# Patient Record
Sex: Male | Born: 1945 | Race: White | Hispanic: No | Marital: Married
Health system: Southern US, Community
[De-identification: ages and names within clinical notes are randomized; demographics above are authoritative.]

---

## 2004-02-08 ENCOUNTER — Encounter: Admission: RE | Admit: 2004-02-08 | Discharge: 2004-02-08 | Payer: Self-pay | Admitting: Internal Medicine

## 2004-02-08 ENCOUNTER — Inpatient Hospital Stay (HOSPITAL_COMMUNITY): Admission: EM | Admit: 2004-02-08 | Discharge: 2004-02-14 | Payer: Self-pay | Admitting: Emergency Medicine

## 2005-02-18 IMAGING — US US ABDOMEN COMPLETE
1 series · 14 of 25 positions shown · non-contrast
Comparison: none

CLINICAL DATA: Abdominal pain. 
 UPPER ABDOMINAL SONOGRAM COMPLETE
 Multiplanar imaging shows normal gallbladder size and contours.  No stones or wall thickening, but there is reproducible sludge in the dependent portion.  Sludge can be normal in patients who have been fasting for several days.  It is abnormal if the patient has been eating a normal diet.  This needs to be clinically correlated.  Consider nuclear HIDA study if further assessment warranted.  Common duct 4.8 mm.  No focal lesions of the liver or spleen.  Kidney size normal.  No hydronephrosis.  Aorta and IVC normal. 
 IMPRESSION
 Normal except for the presence of biliary sludge ? see report.

[Series 1: unknown · 0.33mm/px · 14 of 66 slices shown]
[im 1/66]
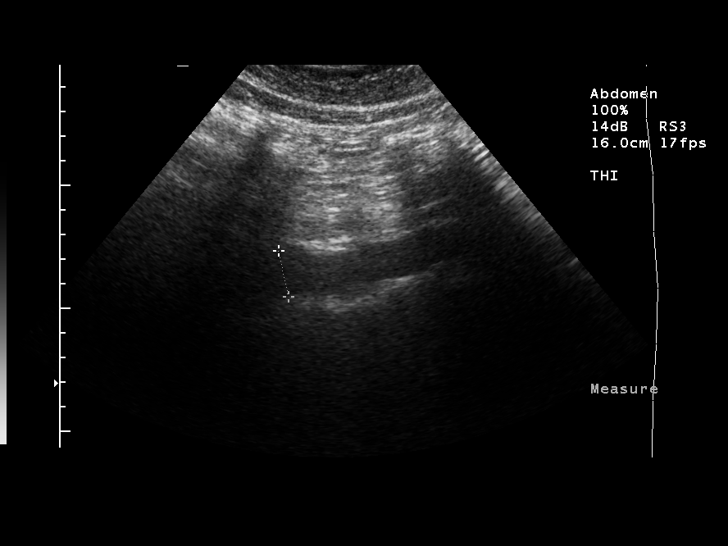
[im 6/66]
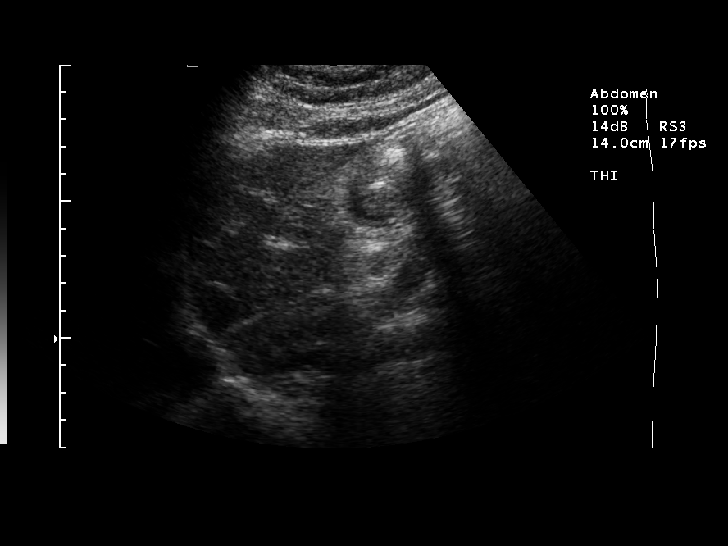
[im 11/66]
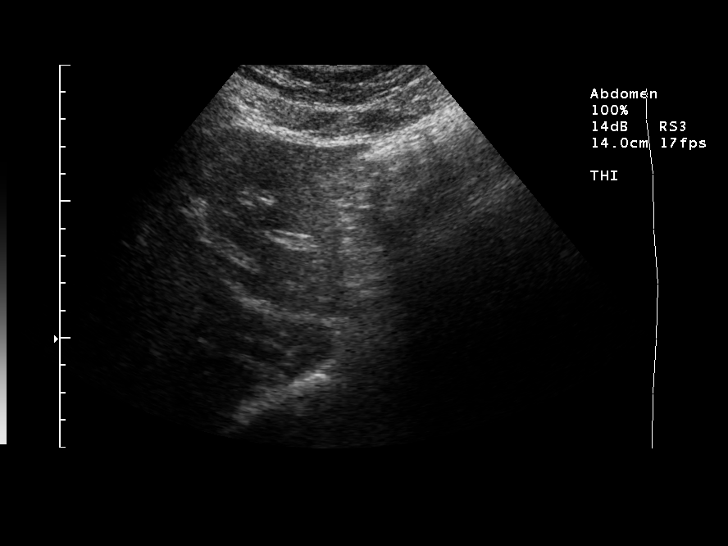
[im 17/66]
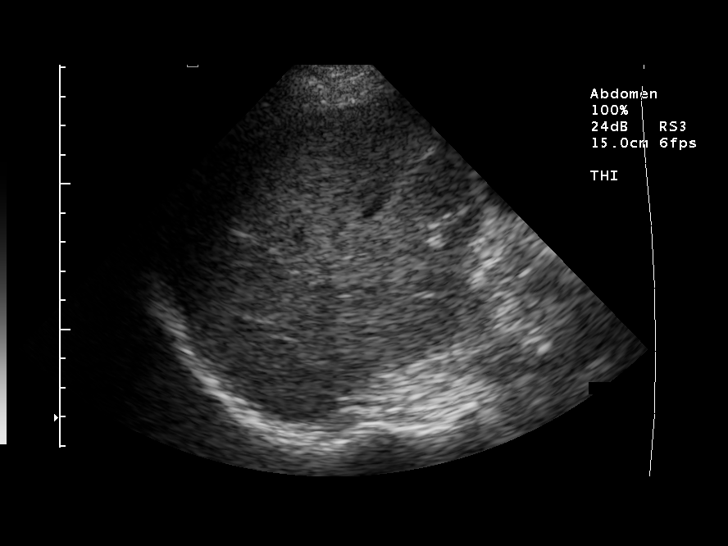
[im 22/66]
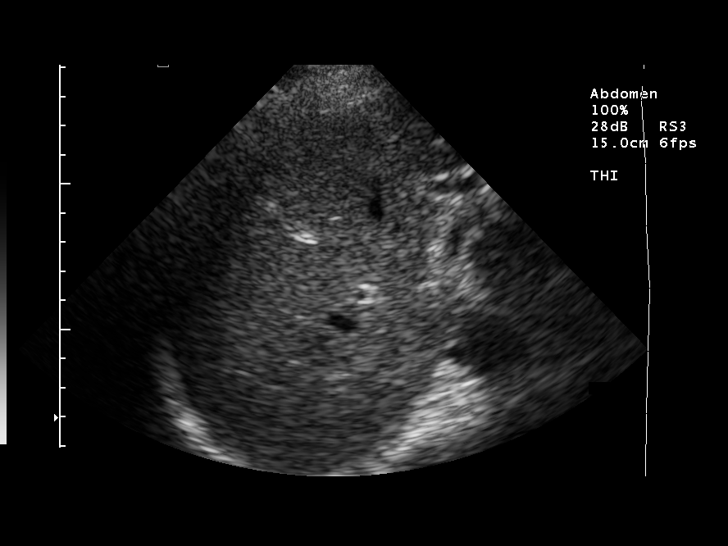
[im 25/66]
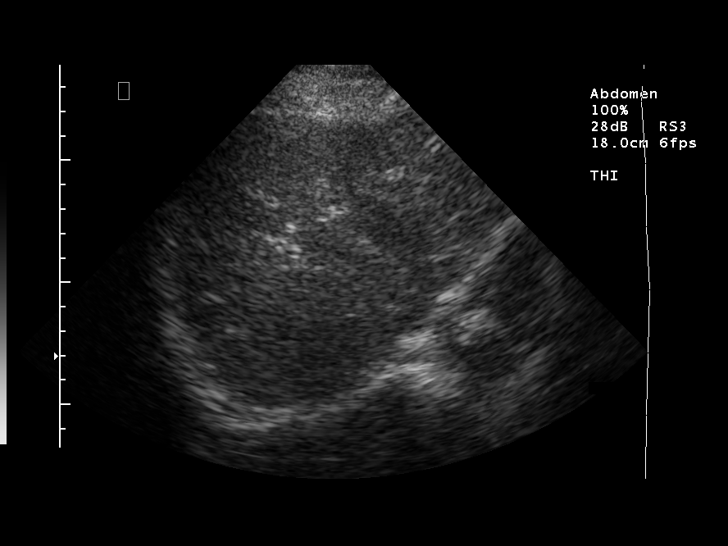
[im 30/66]
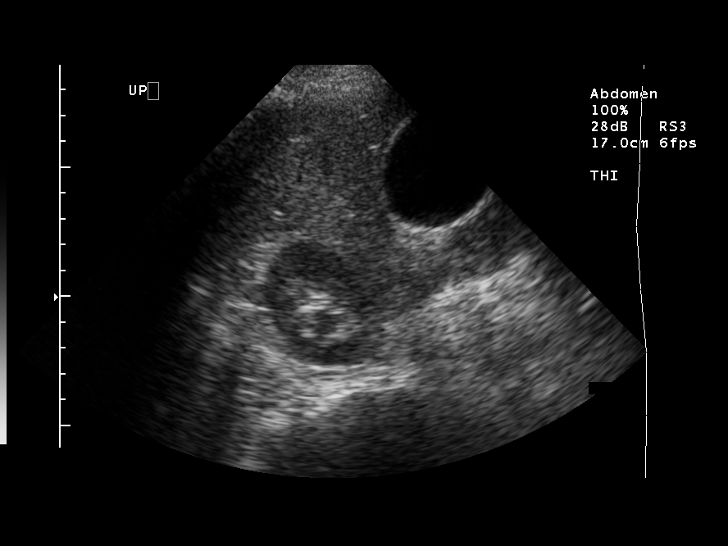
[im 36/66]
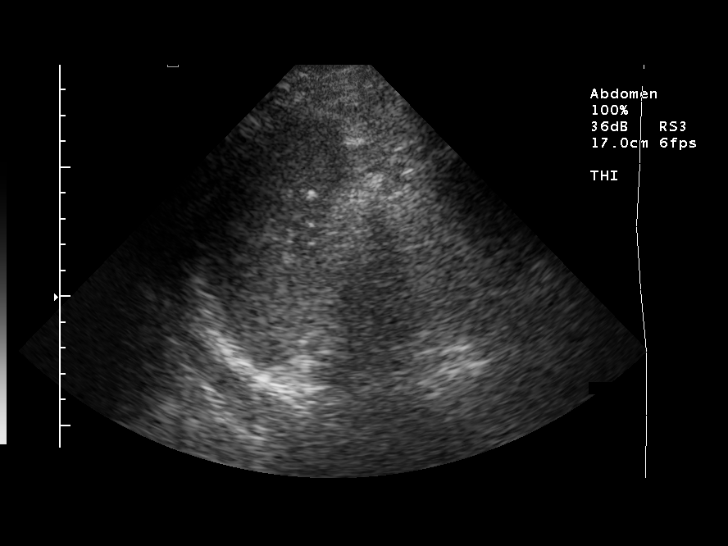
[im 41/66]
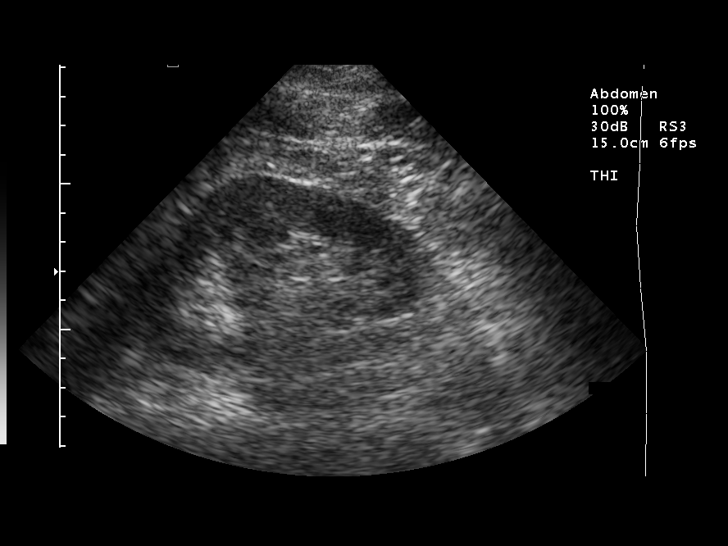
[im 44/66]
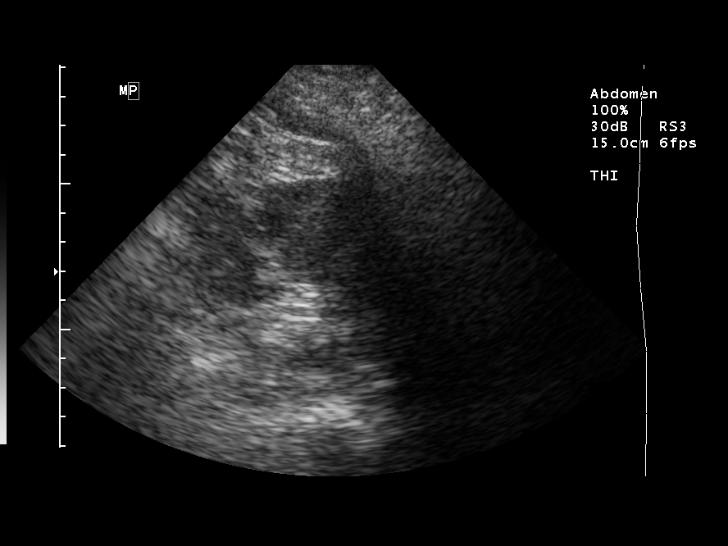
[im 49/66]
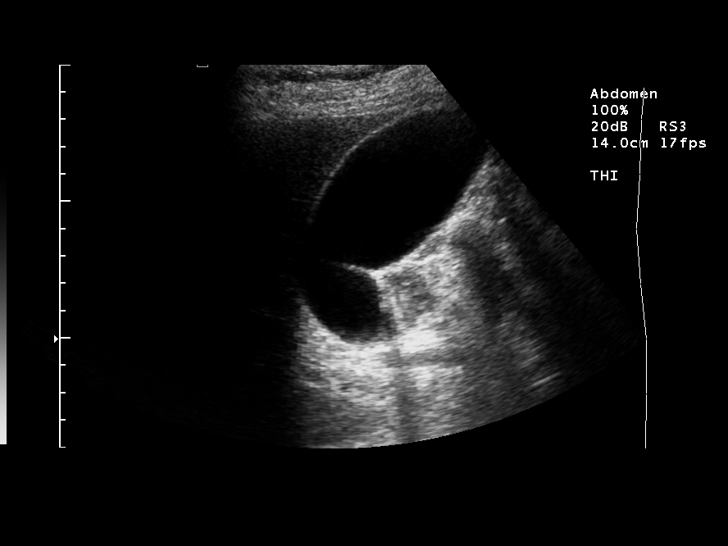
[im 55/66]
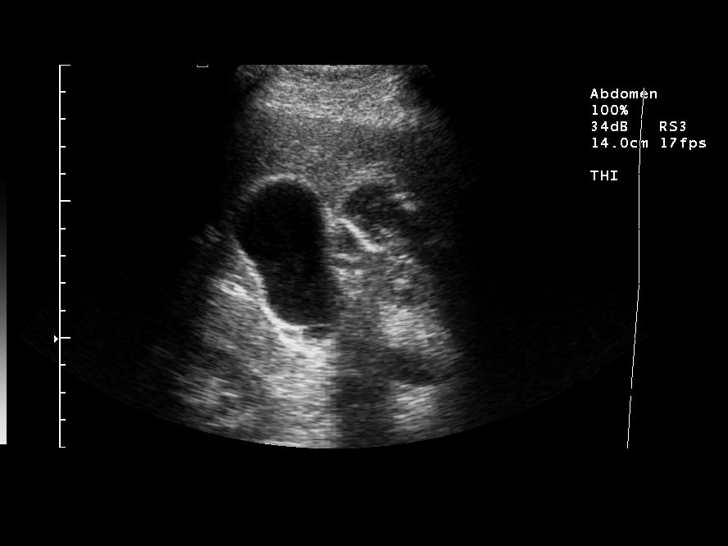
[im 60/66]
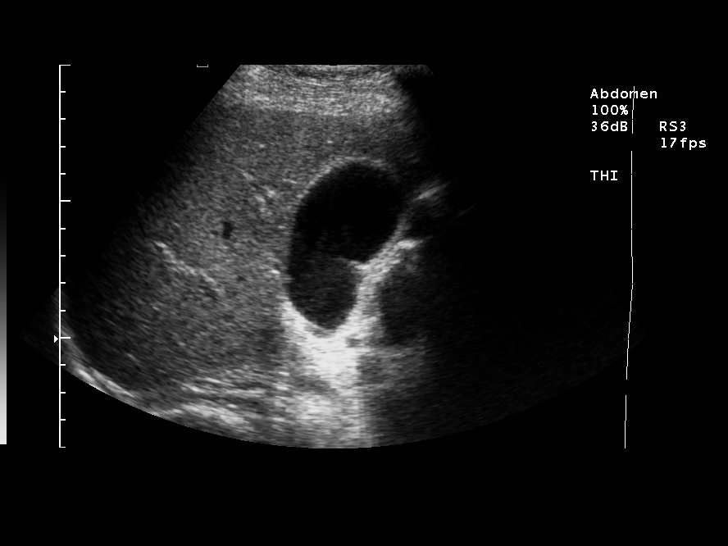
[im 66/66]
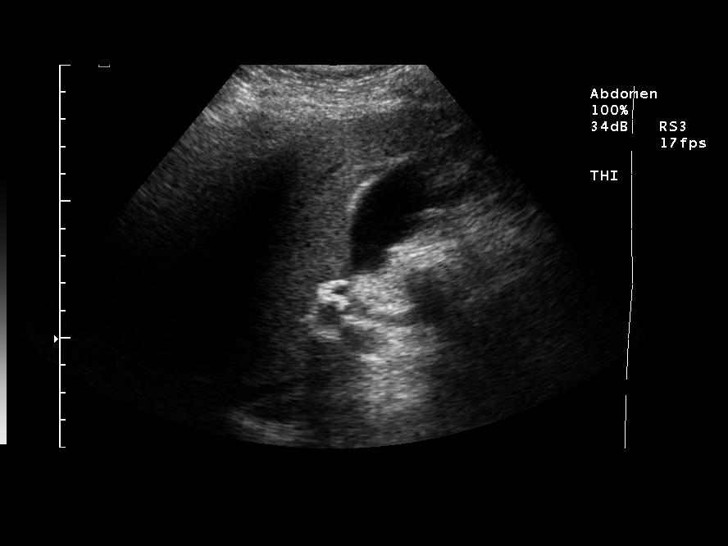

[14 of 25 positions shown; findings below may reference images not displayed]

## 2005-02-19 IMAGING — CT CT PELVIS W/ CM
1 series · 14 of 32 positions shown, 18 images · IV contrast (GASTRO & 150 CC OMNI 300)
Comparison: none

CLINICAL DATA: 57-year-old with abdominal pain.  Duodenal obstruction. 
CT ABDOMEN AND PELVIS WITH CONTRAST 
Helical CT examination of the abdomen and pelvis was performed after the bolus infusion of 100 cc of Omnipaque 300 and the use of dilute oral contrast.  The lung bases are clear except for some minimal dependent atelectasis.
CT ABDOMEN 
The patient has a hiatal hernia.  The liver, spleen, adrenal glands, and kidneys demonstrate no significant findings.  The pancreas appears normal.  The common bile duct is normal in caliber and the gallbladder appears normal.  There is an abnormality involving the duodenum.  In the beginning of the second portion of the duodenum there appears to be some fatty infiltration of the duodenal wall with some slight fold thickening.  It could be fibrofatty change that can be seen with Crohn?s disease.  Adjacent to the duodenum in the anterior pararenal space is a complex area of soft tissue thickening and/or complex fluid.  It may be a periduodenal abscess or a walled off perforation of some sort.  I do not see any definite contrast in this area to suggest active extravasation.  The stomach is not significantly distended and this contrast gets all the way through the colon.  I do not see any adenopathy.  The aorta and major branch vessels are normal.  The IVC is normal although it may be slightly compressed by this complex fluid collection or soft tissue.  The third and fourth portion of the duodenum appear normal.  The remainder of the small bowel is unremarkable and the colon demonstrates no significant abnormalities.
IMPRESSION
1.  Unusual appearance of the descending duodenum.  There appears to be fibrofatty infiltration and fold thickening which is a finding that can be seen with Crohn?s disease or other chronic inflammatory process.  Adjacent to the duodenum in the anterior pararenal space is either a complex fluid collection or a soft tissue mass which may be a focal walled off perforation.  The remainder of the duodenum and small bowel are unremarkable.
2.  No other significant findings in the abdomen. 
CT PELVIS 
No significant findings.  I do not see any significant abnormality involving the terminal ileum or cecum.  There is a small amount of fluid in the right paracolic gutter but I think this is probably due to a process in the abdomen.
1.  Unremarkable CT pelvis.

[Series 2: abd pelvis · axial · 0.66mm/px · z∈[-503,-93]mm · 14 of 120 slices shown, 18 images]
[im 8/120  soft-tissue]
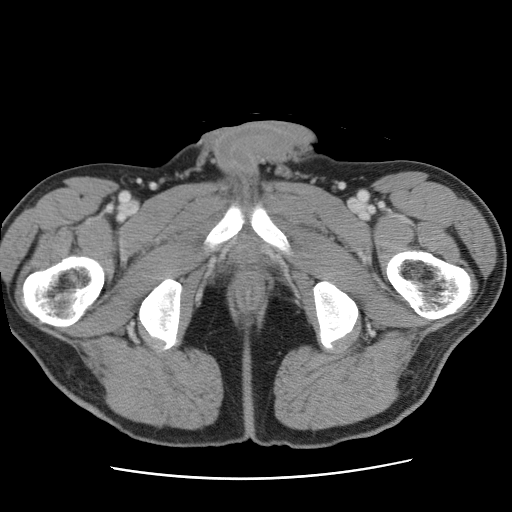
[im 8/120  bone]
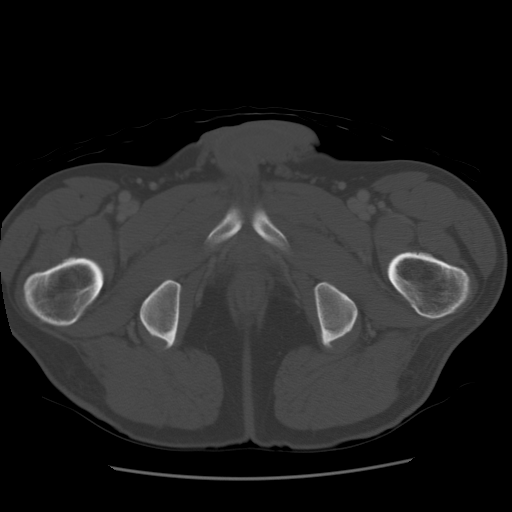
[im 16/120  soft-tissue]
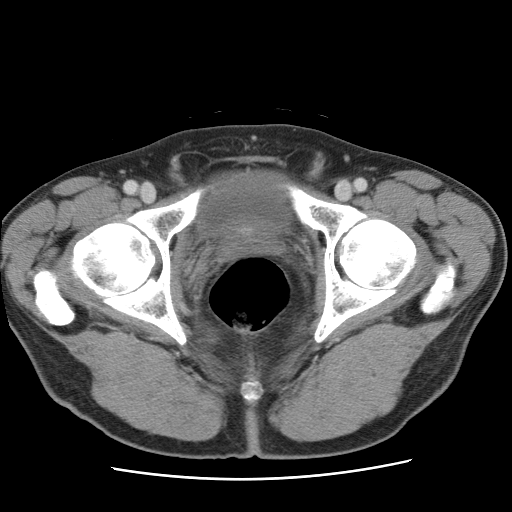
[im 27/120  soft-tissue]
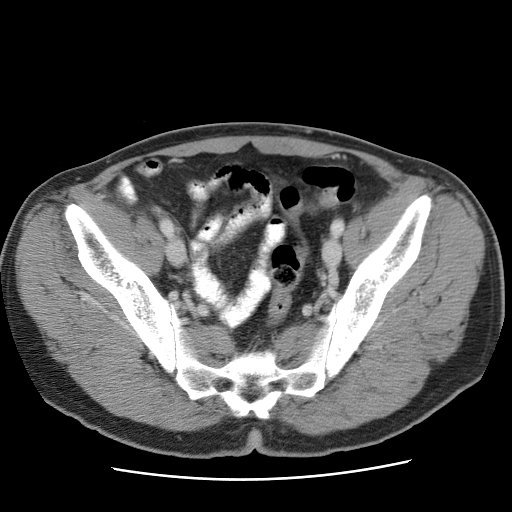
[im 35/120  soft-tissue]
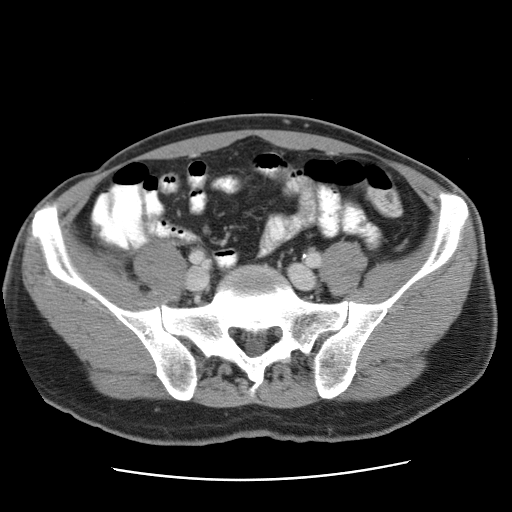
[im 47/120  soft-tissue]
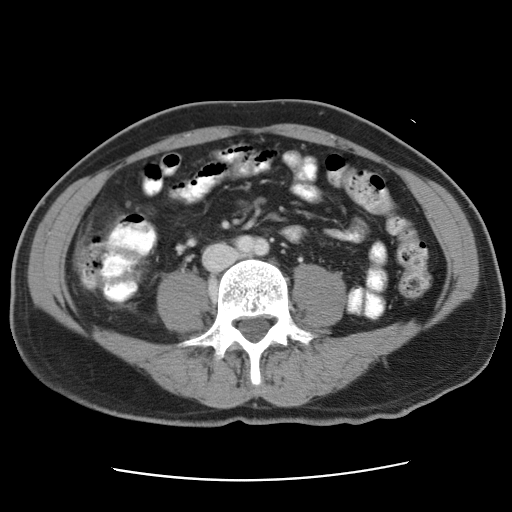
[im 54/120  soft-tissue]
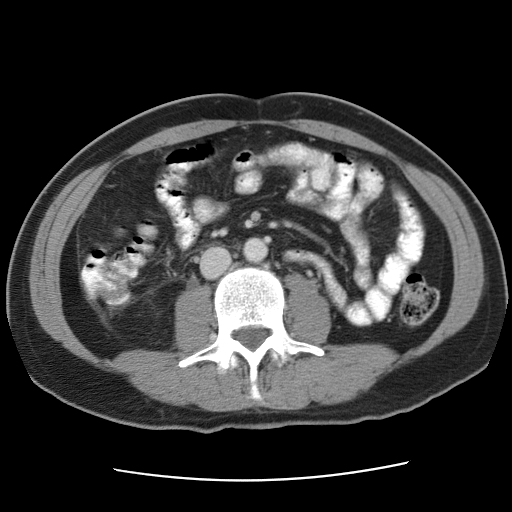
[im 66/120  soft-tissue]
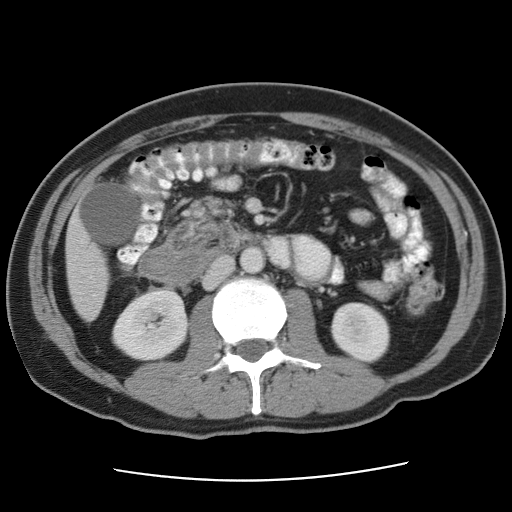
[im 73/120  soft-tissue]
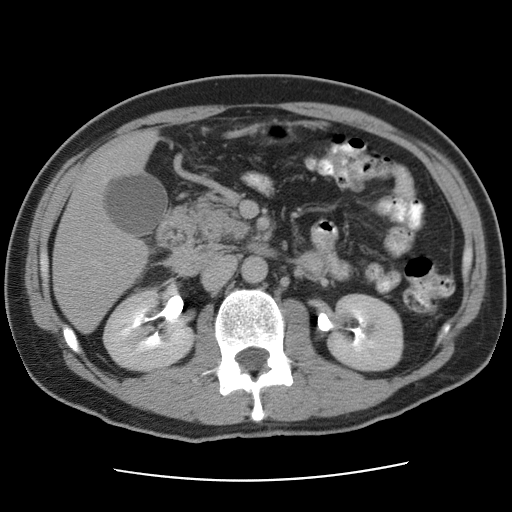
[im 85/120  soft-tissue]
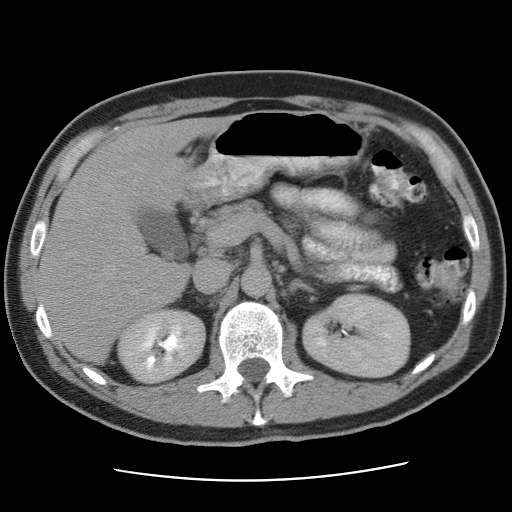
[im 85/120  bone]
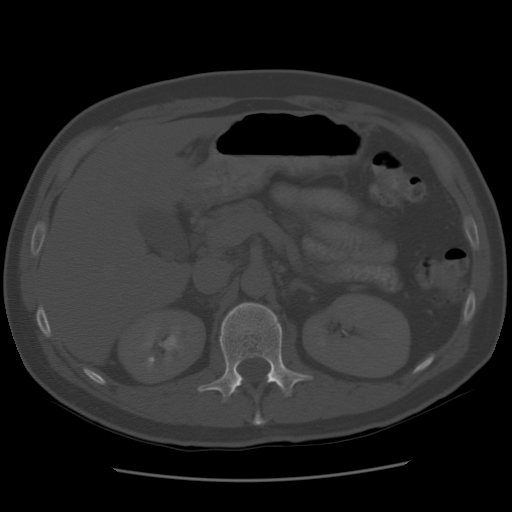
[im 93/120  soft-tissue]
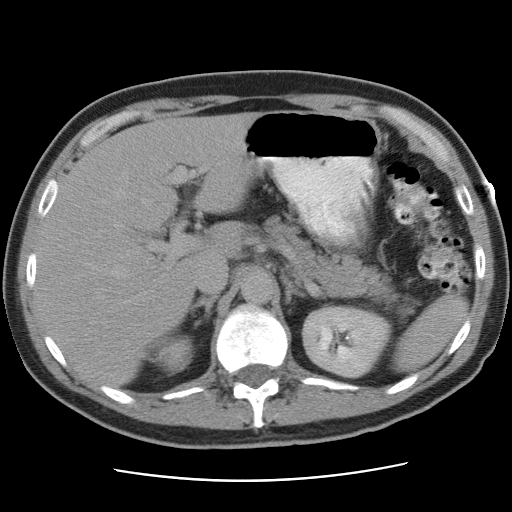
[im 104/120  soft-tissue]
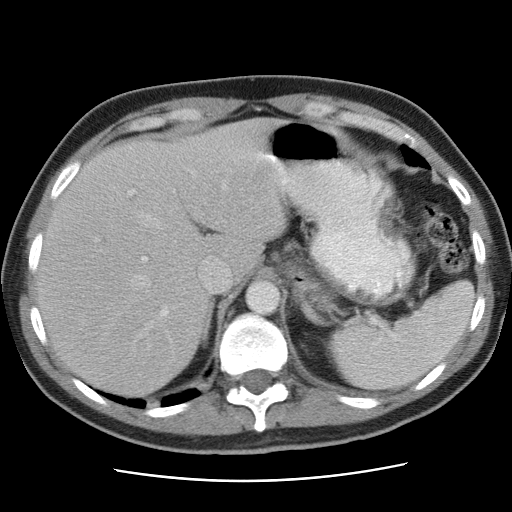
[im 104/120  lung]
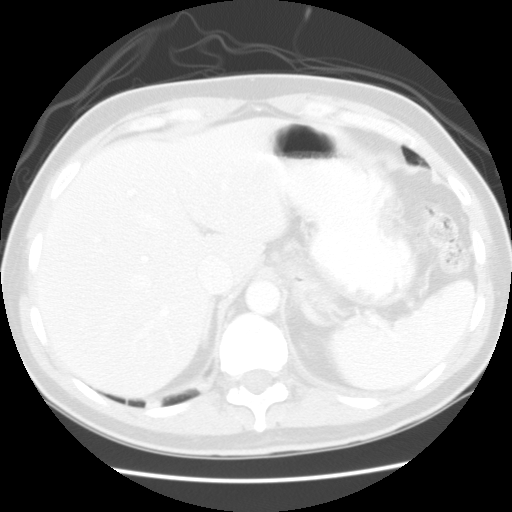
[im 108/120  lung]
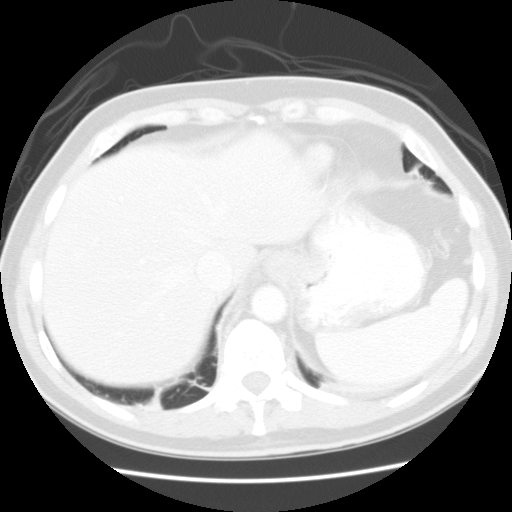
[im 112/120  soft-tissue]
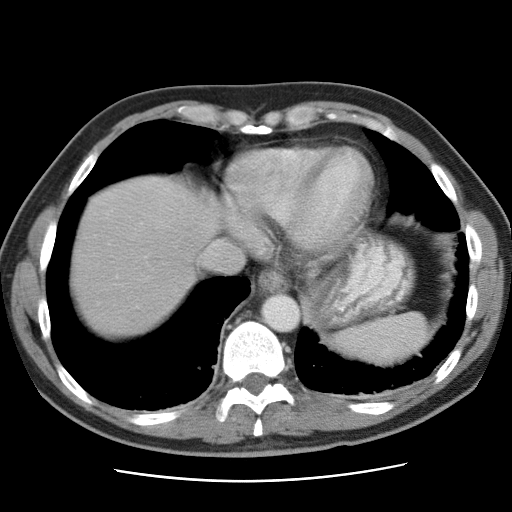
[im 112/120  lung]
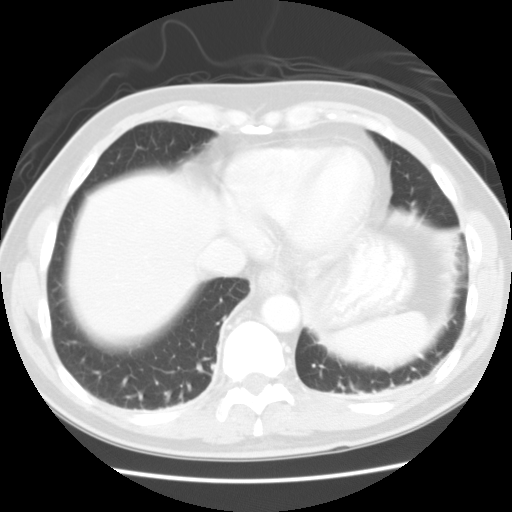
[im 116/120  lung]
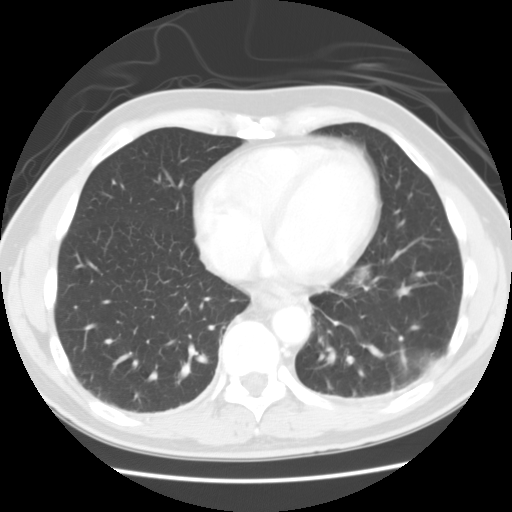

[14 of 32 positions shown; findings below may reference images not displayed]

## 2005-02-19 IMAGING — CR DG ABDOMEN 1V
1 series · 1 of 1 positions shown · non-contrast
Comparison: none

CLINICAL DATA: Abdominal pain.
 KUB
 No prior studies for comparison:
 Bowel gas pattern normal.  Psoas margins intact.  No calcified gallstones or other abnormal calcifications.  NG tube in place with its tip in the region of the distal antrum or pylorus.  
 IMPRESSION
 Unremarkable except for the presence of an NG tube.

[view not recorded]
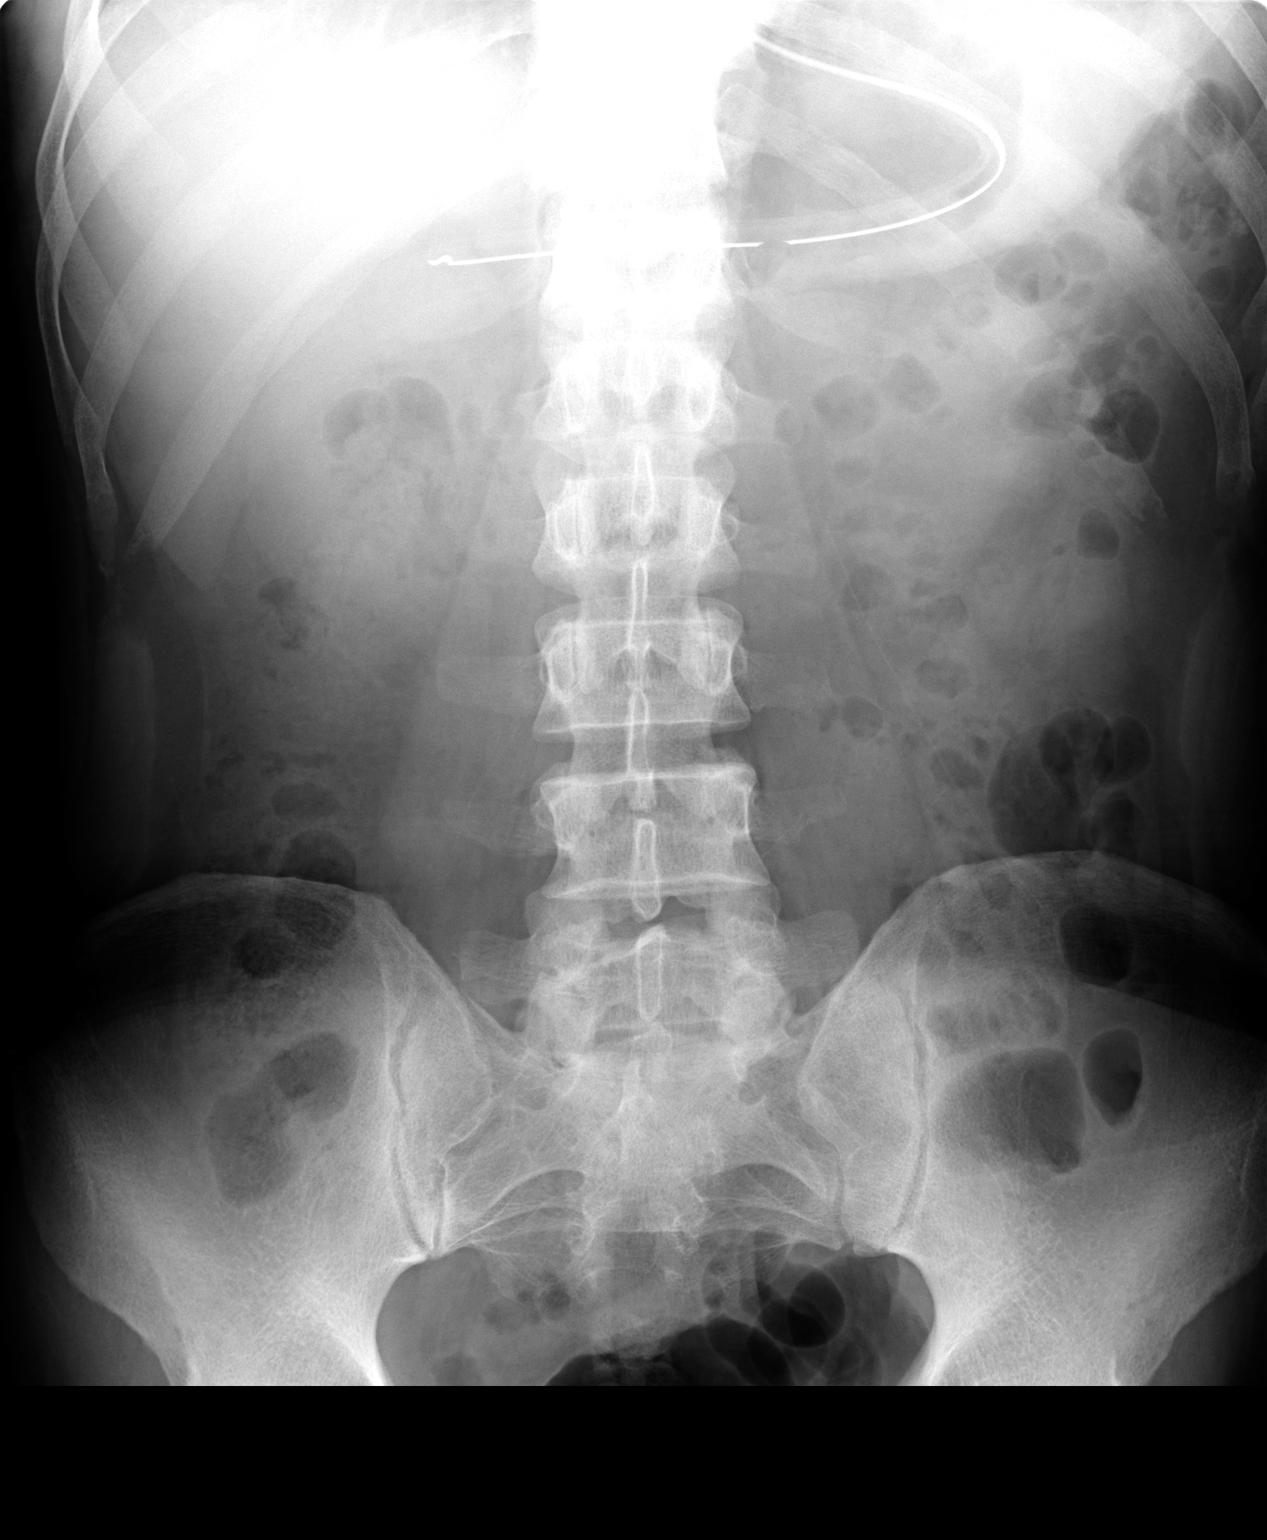

[1 of 1 positions shown; findings below may reference images not displayed]

## 2010-12-17 ENCOUNTER — Encounter: Payer: Self-pay | Admitting: Gastroenterology

## 2020-03-31 ENCOUNTER — Ambulatory Visit: Payer: Self-pay | Attending: Internal Medicine

## 2020-03-31 DIAGNOSIS — Z23 Encounter for immunization: Secondary | ICD-10-CM

## 2020-03-31 NOTE — Progress Notes (Signed)
   Covid-19 Vaccination Clinic  Name:  Billy Bright    MRN: [id number] DOB: Jul 11, 1946  03/31/2020  [name] was observed post Covid-19 immunization for 15 minutes without incident. He was provided with Vaccine Information Sheet and instruction to access the V-Safe system.   [name] was instructed to call 911 with any severe reactions post vaccine: Marland Kitchen Difficulty breathing  . Swelling of face and throat  . A fast heartbeat  . A bad rash all over body  . Dizziness and weakness   Immunizations Administered    Name Date Dose VIS Date Route   Moderna COVID-19 Vaccine 03/31/2020  8:50 AM 0.5 mL 10/2019 Intramuscular   Manufacturer: Moderna   Lot: [id number]   NDC: 26691-675-61

## 2020-04-28 ENCOUNTER — Ambulatory Visit: Payer: Medicare Other | Attending: Internal Medicine

## 2020-04-28 DIAGNOSIS — Z23 Encounter for immunization: Secondary | ICD-10-CM

## 2020-04-28 NOTE — Progress Notes (Signed)
   Covid-19 Vaccination Clinic  Name:  Billy Bright    MRN: [id number] DOB: 1946/06/23  04/28/2020  [name] was observed post Covid-19 immunization for 15 minutes without incident. He was provided with Vaccine Information Sheet and instruction to access the V-Safe system.   [name] was instructed to call 911 with any severe reactions post vaccine: Marland Kitchen Difficulty breathing  . Swelling of face and throat  . A fast heartbeat  . A bad rash all over body  . Dizziness and weakness   Immunizations Administered    Name Date Dose VIS Date Route   Moderna COVID-19 Vaccine 04/28/2020  8:52 AM 0.5 mL 10/2019 Intramuscular   Manufacturer: Moderna   Lot: [id number]   NDC: 77116-579-03

## 2020-11-03 ENCOUNTER — Ambulatory Visit: Payer: Medicare Other | Attending: Internal Medicine

## 2020-11-03 DIAGNOSIS — Z23 Encounter for immunization: Secondary | ICD-10-CM

## 2020-11-03 NOTE — Progress Notes (Signed)
   Covid-19 Vaccination Clinic  Name:  Billy Bright    MRN: [id number] DOB: Apr 05, 1946  11/03/2020  [name] was observed post Covid-19 immunization for 15 minutes without incident. He was provided with Vaccine Information Sheet and instruction to access the V-Safe system.   [name] was instructed to call 911 with any severe reactions post vaccine: Marland Kitchen Difficulty breathing  . Swelling of face and throat  . A fast heartbeat  . A bad rash all over body  . Dizziness and weakness   Immunizations Administered    No immunizations on file.
